# Patient Record
Sex: Male | Born: 1980 | Race: Black or African American | Hispanic: No | Marital: Single | Smoking: Current every day smoker
Health system: Southern US, Community
[De-identification: ages and names within clinical notes are randomized; demographics above are authoritative.]

## PROBLEM LIST (undated history)

## (undated) ENCOUNTER — Emergency Department (HOSPITAL_COMMUNITY): Admission: EM | Payer: BLUE CROSS/BLUE SHIELD | Source: Home / Self Care

---
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
## 2002-10-10 ENCOUNTER — Emergency Department (HOSPITAL_COMMUNITY): Admission: EM | Admit: 2002-10-10 | Discharge: 2002-10-10 | Payer: Self-pay | Admitting: Emergency Medicine

## 2002-11-04 ENCOUNTER — Emergency Department (HOSPITAL_COMMUNITY): Admission: AD | Admit: 2002-11-04 | Discharge: 2002-11-04 | Payer: Self-pay | Admitting: Emergency Medicine

## 2002-11-04 ENCOUNTER — Encounter: Payer: Self-pay | Admitting: Emergency Medicine

## 2004-01-25 ENCOUNTER — Emergency Department (HOSPITAL_COMMUNITY): Admission: EM | Admit: 2004-01-25 | Discharge: 2004-01-25 | Payer: Self-pay | Admitting: Family Medicine

## 2004-08-24 ENCOUNTER — Emergency Department (HOSPITAL_COMMUNITY): Admission: EM | Admit: 2004-08-24 | Discharge: 2004-08-24 | Payer: Self-pay | Admitting: Emergency Medicine

## 2004-09-18 ENCOUNTER — Inpatient Hospital Stay (HOSPITAL_COMMUNITY): Admission: EM | Admit: 2004-09-18 | Discharge: 2004-09-19 | Payer: Self-pay | Admitting: Emergency Medicine

## 2004-10-25 ENCOUNTER — Ambulatory Visit (HOSPITAL_COMMUNITY): Admission: RE | Admit: 2004-10-25 | Discharge: 2004-10-25 | Payer: Self-pay | Admitting: Otolaryngology

## 2005-02-20 ENCOUNTER — Emergency Department (HOSPITAL_COMMUNITY): Admission: EM | Admit: 2005-02-20 | Discharge: 2005-02-20 | Payer: Self-pay | Admitting: *Deleted

## 2005-03-12 ENCOUNTER — Emergency Department (HOSPITAL_COMMUNITY): Admission: EM | Admit: 2005-03-12 | Discharge: 2005-03-12 | Payer: Self-pay | Admitting: Emergency Medicine

## 2005-06-29 ENCOUNTER — Encounter: Payer: Self-pay | Admitting: Emergency Medicine

## 2005-11-03 ENCOUNTER — Emergency Department (HOSPITAL_COMMUNITY): Admission: EM | Admit: 2005-11-03 | Discharge: 2005-11-03 | Payer: Self-pay | Admitting: Emergency Medicine

## 2006-10-09 ENCOUNTER — Emergency Department (HOSPITAL_COMMUNITY): Admission: EM | Admit: 2006-10-09 | Discharge: 2006-10-09 | Payer: Self-pay | Admitting: Emergency Medicine

## 2007-05-06 IMAGING — CR DG KNEE COMPLETE 4+V*L*
4 series · 4 of 4 positions shown · non-contrast
Comparison: none

CLINICAL DATA: The patient fell and has medial left knee pain.  
 LEFT KNEE ? 4 VIEW:
 Negative for fracture or malalignment.  Suspicion for knee joint effusion.

[t knee ap left]
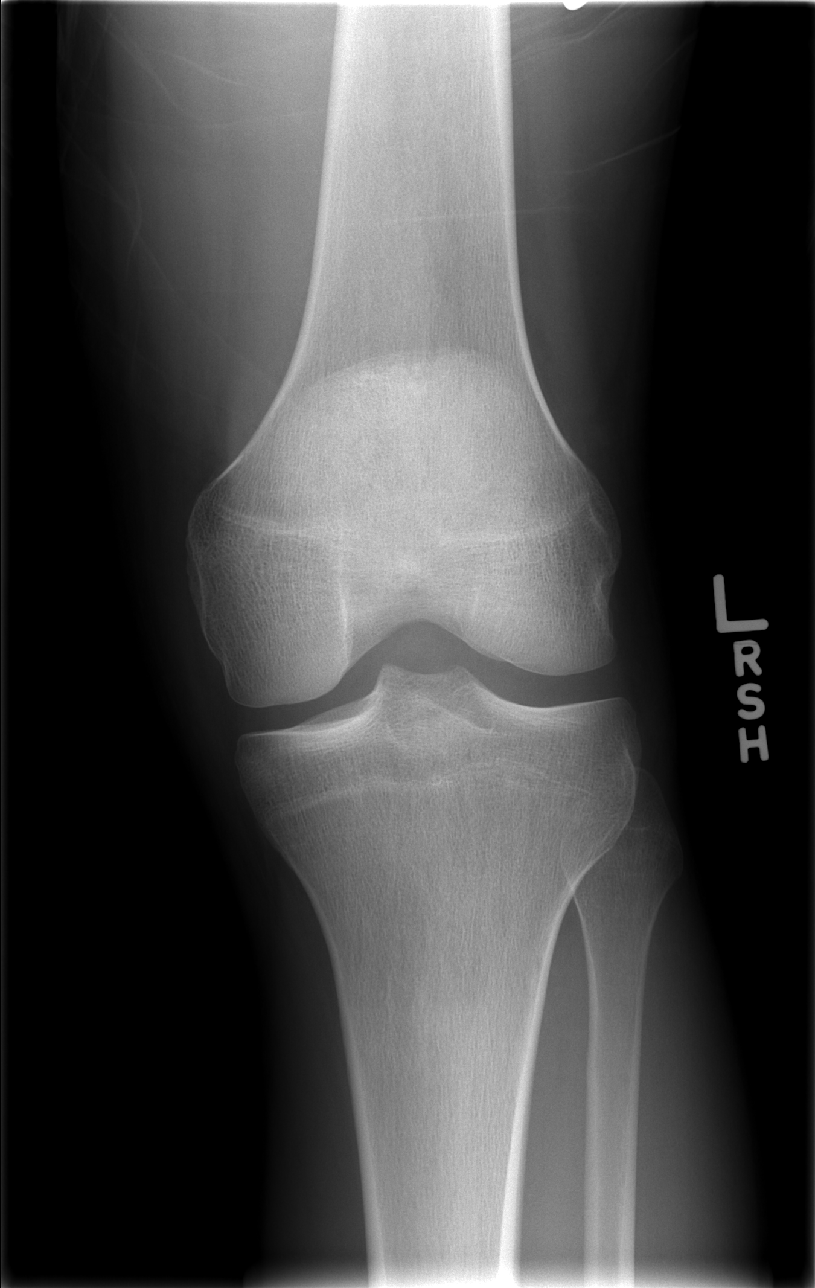

[t knee oblique left (1 of 2)]
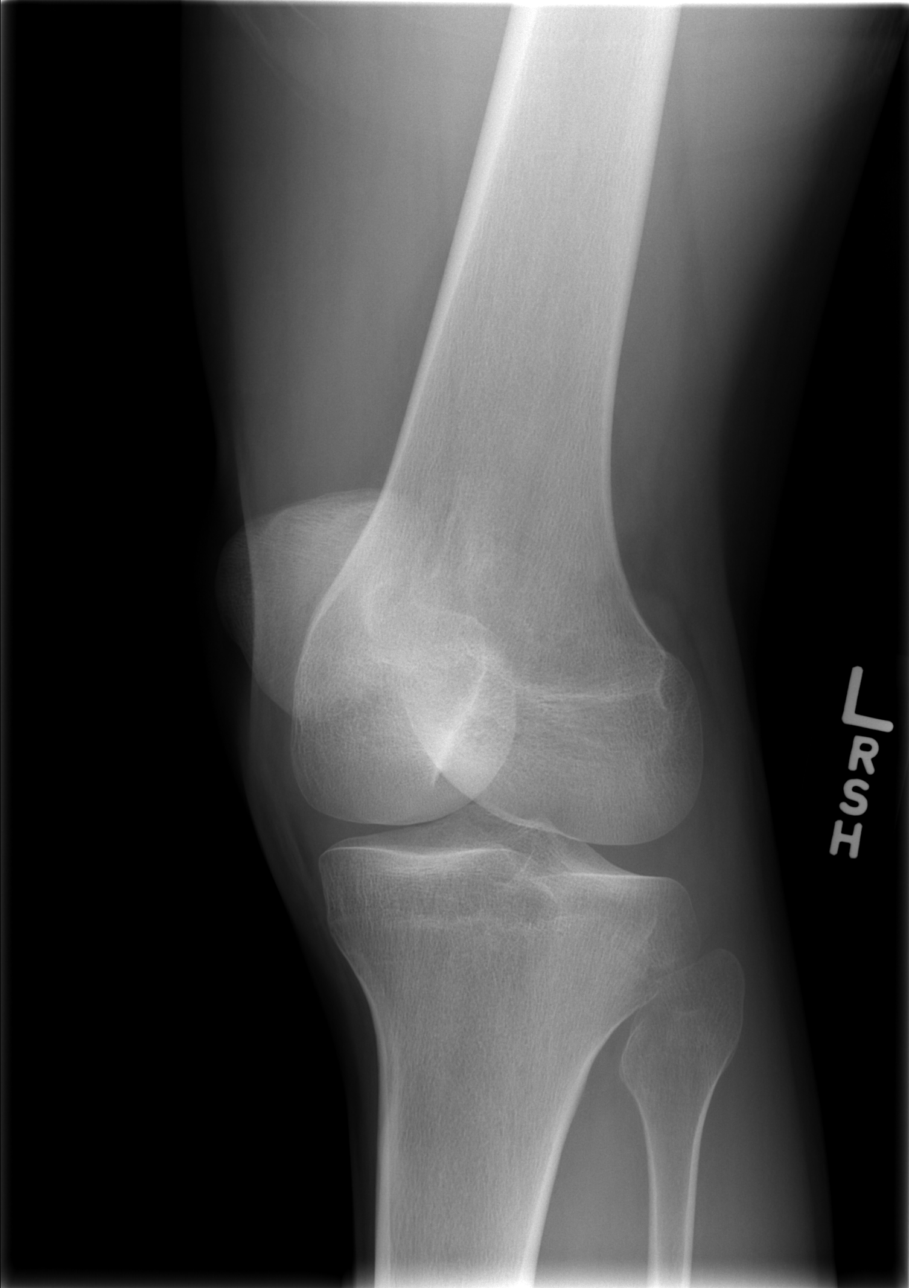

[t knee oblique left (2 of 2)]
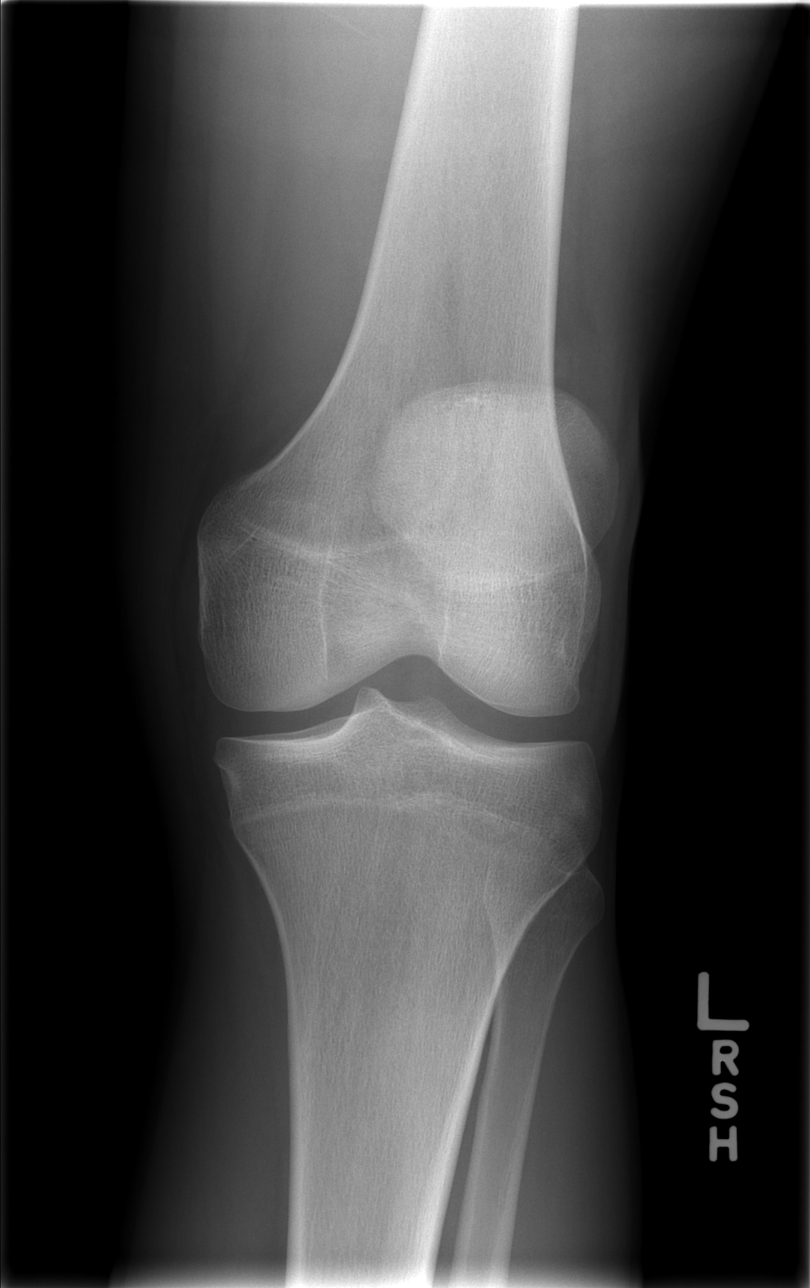

[t knee lat left]
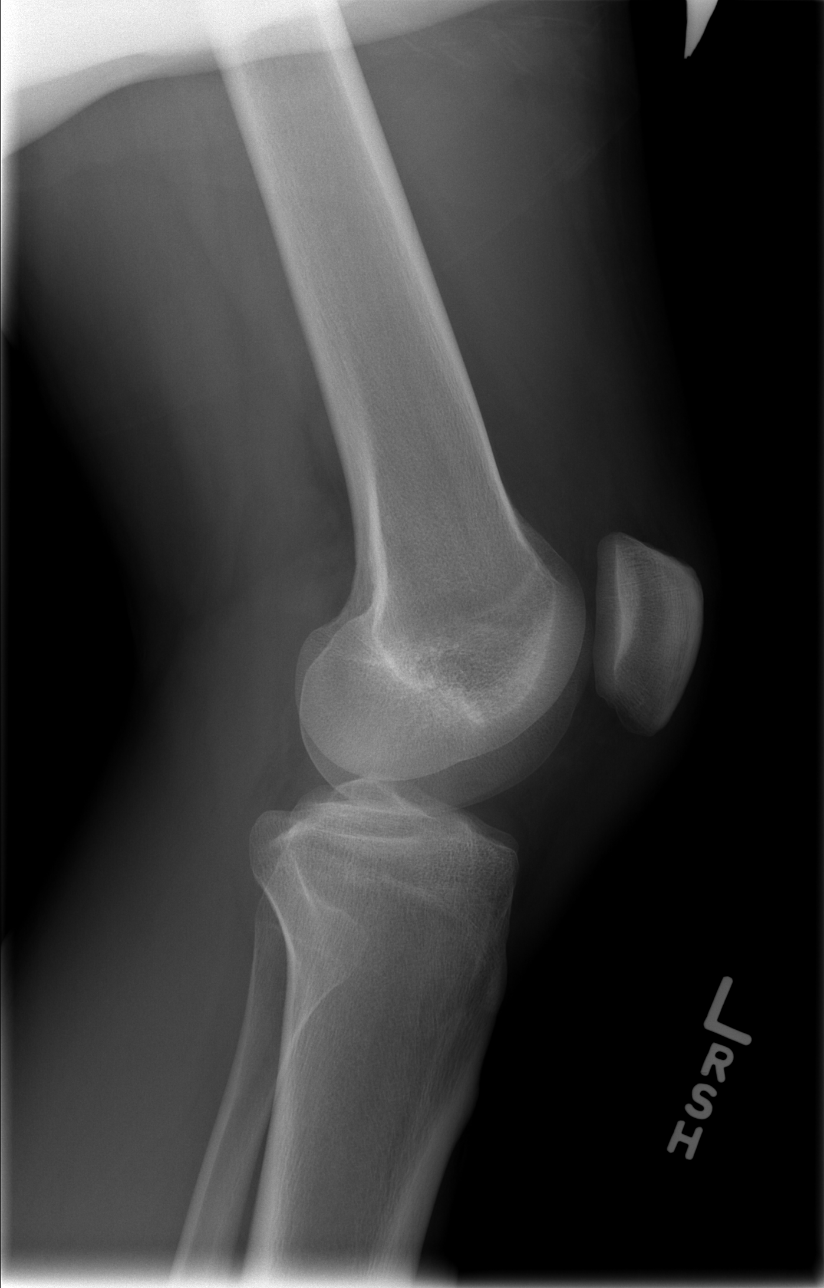

[4 of 4 positions shown; findings below may reference images not displayed]

IMPRESSION: No acute bony abnormality.  Suspicion for knee joint effusion. 
 LEFT ANKLE - 3 VIEW:
 Negative for fracture or subluxation.
IMPRESSION: No acute left ankle bony abnormality.
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
## 2010-04-27 ENCOUNTER — Inpatient Hospital Stay (INDEPENDENT_AMBULATORY_CARE_PROVIDER_SITE_OTHER)
Admission: RE | Admit: 2010-04-27 | Discharge: 2010-04-27 | Disposition: A | Discharge status: Home / Self Care | Payer: Self-pay | Source: Ambulatory Visit | Attending: Family Medicine | Admitting: Family Medicine

## 2010-04-27 ENCOUNTER — Ambulatory Visit (INDEPENDENT_AMBULATORY_CARE_PROVIDER_SITE_OTHER): Payer: Self-pay

## 2010-04-27 DIAGNOSIS — S9030XA Contusion of unspecified foot, initial encounter: Secondary | ICD-10-CM

## 2010-07-16 NOTE — Op Note (Signed)
Kevin Sims, Kevin Sims              ACCOUNT NO.:  [id number]   MEDICAL RECORD NO.:  [id number]          PATIENT TYPE:  AMB   LOCATION:  SDS                          FACILITY:  [facility]   PHYSICIAN:  Suzanna Obey, M.D.       DATE OF BIRTH:  1980/11/18   DATE OF PROCEDURE:  10/25/2004  DATE OF DISCHARGE:                                 OPERATIVE REPORT   PREOPERATIVE DIAGNOSIS:  Mandible fracture.   POSTOPERATIVE DIAGNOSIS:  Mandible fracture.   OPERATION PERFORMED:  Removal of screws.   SURGEON:  Suzanna Obey, M.D.   ANESTHESIA:  Small amount of local with sedation.   ESTIMATED BLOOD LOSS:  Less than 5 mL.   INDICATIONS FOR PROCEDURE:  This is a 30 year old who had a mandible  fracture and now has been in bicortical screws for five and a half weeks.  He now is ready to proceed.  He cut the wires over the weekend and he is  doing well.  His teeth feel like they meet well.  He is now here for screw  removal.  He was informed of the risks and benefits of the procedure  including bleeding, infection, scarring, nonunion and malunion, and numbness  of the teeth and lip, and risks of the anesthetic.  All questions were  answered and consent was obtained.   DESCRIPTION OF PROCEDURE:  The patient was taken to the operating room after  local with sedation.  The screws were removed with the extractor Leibinger  screw driver.  They were not mucosalized.  The patient tolerated this very  well.  His occlusion looks excellent and there is good opening of the  mandible.  There was minimal to no bleeding.  The injury sites looked good.  He was then already awake.  He was taken to recovery room in stable  condition.  Counts were correct.           ______________________________  Suzanna Obey, M.D.     JB/MEDQ  D:  10/25/2004  T:  10/25/2004  Job:  [id number]
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
## 2016-11-10 ENCOUNTER — Emergency Department (HOSPITAL_COMMUNITY): Payer: BLUE CROSS/BLUE SHIELD

## 2016-11-10 ENCOUNTER — Encounter (HOSPITAL_COMMUNITY): Payer: Self-pay | Admitting: Emergency Medicine

## 2016-11-10 ENCOUNTER — Emergency Department (HOSPITAL_COMMUNITY)
Admission: EM | Admit: 2016-11-10 | Discharge: 2016-11-10 | Disposition: A | Discharge status: Home / Self Care | Payer: BLUE CROSS/BLUE SHIELD | Attending: Emergency Medicine | Admitting: Emergency Medicine

## 2016-11-10 DIAGNOSIS — R1030 Lower abdominal pain, unspecified: Secondary | ICD-10-CM | POA: Insufficient documentation

## 2016-11-10 DIAGNOSIS — F17210 Nicotine dependence, cigarettes, uncomplicated: Secondary | ICD-10-CM | POA: Insufficient documentation

## 2016-11-10 DIAGNOSIS — N50811 Right testicular pain: Secondary | ICD-10-CM | POA: Diagnosis not present

## 2016-11-10 DIAGNOSIS — X500XXA Overexertion from strenuous movement or load, initial encounter: Secondary | ICD-10-CM | POA: Insufficient documentation

## 2016-11-10 LAB — URINALYSIS, ROUTINE W REFLEX MICROSCOPIC
Bilirubin Urine: NEGATIVE
GLUCOSE, UA: NEGATIVE mg/dL
HGB URINE DIPSTICK: NEGATIVE
Ketones, ur: NEGATIVE mg/dL
LEUKOCYTES UA: NEGATIVE
Nitrite: NEGATIVE
PH: 7 (ref 5.0–8.0)
PROTEIN: NEGATIVE mg/dL
SPECIFIC GRAVITY, URINE: 1.016 (ref 1.005–1.030)

## 2016-11-10 MED ORDER — IBUPROFEN 800 MG PO TABS: 800.0000 mg | ORAL_TABLET | Freq: Three times a day (TID) | ORAL | 0 refills | Status: DC

## 2016-11-10 MED ORDER — IBUPROFEN 800 MG PO TABS
800.0000 mg | ORAL_TABLET | Freq: Once | ORAL | Status: AC
  Administered 2016-11-10: 800 mg via ORAL
  Filled 2016-11-10: qty 1

## 2016-11-10 NOTE — Discharge Instructions (Signed)
1. Schedule a follow-up appointment with a urologist as listed in your discharge instructions for approximately one week.  2. Read instructions for precautionary statements regarding epididymitis and hernia.

## 2016-11-10 NOTE — ED Triage Notes (Addendum)
Patient c/o right testicle pain and swelling since Tuesday after working out/lifting weights on Monday.  Denies any urinary problems

## 2016-11-10 NOTE — ED Notes (Signed)
Bed: WA15 Expected date:  Expected time:  Means of arrival:  Comments: Triage 2  

## 2016-11-10 NOTE — ED Provider Notes (Signed)
[facility]-EMERGENCY DEPT Provider Note   CSN: [id number] Arrival date & time: 11/10/16  0854     History   Chief Complaint Chief Complaint  Patient presents with  . Testicle Pain    HPI Kevin Sims is a 36 y.o. male.  HPI Patient ports that testicular pain for about 3 days. Is been gradual in onset. Patient has been doing more lifting and working out. He denies however one particular episode where the pain started. He reports his pain in the right groin and the testicle. No associated abdominal pain or back pain. No associated pain burning or urgency of urination. No penile discharge. Patient reports is becoming more painful at work with prolonged walking and standing. No history of prior testicular disorder. No prior medical history. History reviewed. No pertinent past medical history.  There are no active problems to display for this patient.   History reviewed. No pertinent surgical history.     Home Medications    Prior to Admission medications   Not on File    Family History No family history on file.  Social History Social History  Substance Use Topics  . Smoking status: Current Every Day Smoker    Types: Cigarettes  . Smokeless tobacco: Never Used  . Alcohol use Yes     Allergies   Penicillins   Review of Systems Review of Systems 10 Systems reviewed and are negative for acute change except as noted in the HPI.   Physical Exam Updated Vital Signs BP 129/77 (BP Location: Right Arm)   Pulse 74   Resp 16   Ht 5\' 9"  (1.753 m)   Wt 87.5 kg (193 lb)   SpO2 98%   BMI 28.50 kg/m   Physical Exam  Constitutional: He is oriented to person, place, and time. He appears well-developed and well-nourished.  HENT:  Head: Normocephalic and atraumatic.  Eyes: Conjunctivae are normal.  Neck: Neck supple.  Cardiovascular: Normal rate and regular rhythm.   No murmur heard. Pulmonary/Chest: Effort normal and breath sounds normal. No respiratory distress.    Abdominal: Soft. He exhibits no distension. There is no tenderness. There is no guarding.  Genitourinary:  Genitourinary Comments: Penis normal. No scrotal swelling or edema. Right testicle has fullness or palpable nodule at the inferior pole. This appears to be about 1-2 cm. Areas tender. No mass or fullness within the inguinal canal. With Valsalva no appreciable hernia in the right inguinal canal. Left testicle is nontender.  Musculoskeletal: Normal range of motion. He exhibits no edema.  Neurological: He is alert and oriented to person, place, and time. No cranial nerve deficit. He exhibits normal muscle tone. Coordination normal.  Skin: Skin is warm and dry.  Psychiatric: He has a normal mood and affect.  Nursing note and vitals reviewed.    ED Treatments / Results  Labs (all labs ordered are listed, but only abnormal results are displayed) Labs Reviewed  URINALYSIS, ROUTINE W REFLEX MICROSCOPIC    EKG  EKG Interpretation None       Radiology No results found.  Procedures Procedures (including critical care time)  Medications Ordered in ED Medications  ibuprofen (ADVIL,MOTRIN) tablet 800 mg (not administered)     Initial Impression / Assessment and Plan / ED Course  I have reviewed the triage vital signs and the nursing notes.  Pertinent labs & imaging results that were available during my care of the patient were reviewed by me and considered in my medical decision making (see chart for details).  Final Clinical Impressions(s) / ED Diagnoses   Final diagnoses:  Right testicular pain   Ultrasound is negative. No epididymitis identified. Patient does not have pain or burning with urination. He is not having any penile discharge. Pain started with exertional type activities. At this time, I have low suspicion for STI is source of testicular pain. Higher suspicion is for mechanical, physical strain related pain. I will not opt empiric antibiotic treatment at  this time. Patient will take anti-inflammatories elevation and icing. Follow-up plan recommended. New Prescriptions New Prescriptions   No medications on file     Arby Barrette, MD 11/10/16 1156

## 2016-11-11 ENCOUNTER — Telehealth: Payer: Self-pay | Admitting: Student

## 2016-11-11 DIAGNOSIS — A749 Chlamydial infection, unspecified: Secondary | ICD-10-CM

## 2016-11-11 LAB — GC/CHLAMYDIA PROBE AMP (~~LOC~~) NOT AT ARMC
CHLAMYDIA, DNA PROBE: POSITIVE — AB
Neisseria Gonorrhea: NEGATIVE

## 2016-11-11 MED ORDER — AZITHROMYCIN 500 MG PO TABS: 1000.0000 mg | ORAL_TABLET | Freq: Once | ORAL | 0 refills | Status: AC

## 2016-11-11 NOTE — Telephone Encounter (Addendum)
Kevin Sims tested positive for  Chlamydia. Patient was called by RN and allergies and pharmacy confirmed. Rx sent to pharmacy of choice.   Judeth Horn, NP 11/11/2016 5:15 PM       ----- Message from Kathe Becton, RN sent at 11/11/2016  4:00 PM EDT ----- This patient tested positive for:"Chlamydia"," He::"is allergic to  Penicillin"  I have informed the patient of his results and confirmed her pharmacy is correct in his chart. Please send Rx.   Thank you,   Kathe Becton, RN  Pt. Called back and asked to have the medication called in.  Results faxed to [facility].

## 2022-08-27 ENCOUNTER — Emergency Department (HOSPITAL_COMMUNITY)
Admission: EM | Admit: 2022-08-27 | Discharge: 2022-08-27 | Discharge status: Against Medical Advice | Payer: Medicaid Other | Attending: Emergency Medicine | Admitting: Emergency Medicine

## 2022-08-27 DIAGNOSIS — Z5321 Procedure and treatment not carried out due to patient leaving prior to being seen by health care provider: Secondary | ICD-10-CM | POA: Insufficient documentation

## 2022-08-27 DIAGNOSIS — R4182 Altered mental status, unspecified: Secondary | ICD-10-CM | POA: Insufficient documentation

## 2022-08-27 NOTE — ED Notes (Signed)
Pt was seen by staff leaving triage area.  Male visitor followed Pt out.

## 2022-08-28 ENCOUNTER — Other Ambulatory Visit: Payer: Self-pay

## 2022-08-28 ENCOUNTER — Emergency Department (HOSPITAL_COMMUNITY)
Admission: EM | Admit: 2022-08-28 | Discharge: 2022-08-30 | Disposition: A | Discharge status: Other Acute Inpatient Hospital | Payer: Medicaid Other | Attending: Emergency Medicine | Admitting: Emergency Medicine

## 2022-08-28 ENCOUNTER — Encounter (HOSPITAL_COMMUNITY): Payer: Self-pay | Admitting: *Deleted

## 2022-08-28 DIAGNOSIS — Z23 Encounter for immunization: Secondary | ICD-10-CM | POA: Insufficient documentation

## 2022-08-28 DIAGNOSIS — F19959 Other psychoactive substance use, unspecified with psychoactive substance-induced psychotic disorder, unspecified: Secondary | ICD-10-CM

## 2022-08-28 DIAGNOSIS — R456 Violent behavior: Secondary | ICD-10-CM | POA: Insufficient documentation

## 2022-08-28 DIAGNOSIS — Z20822 Contact with and (suspected) exposure to covid-19: Secondary | ICD-10-CM | POA: Diagnosis not present

## 2022-08-28 DIAGNOSIS — F1090 Alcohol use, unspecified, uncomplicated: Secondary | ICD-10-CM | POA: Diagnosis not present

## 2022-08-28 DIAGNOSIS — F19951 Other psychoactive substance use, unspecified with psychoactive substance-induced psychotic disorder with hallucinations: Secondary | ICD-10-CM | POA: Diagnosis not present

## 2022-08-28 DIAGNOSIS — F1990 Other psychoactive substance use, unspecified, uncomplicated: Secondary | ICD-10-CM

## 2022-08-28 DIAGNOSIS — Z79899 Other long term (current) drug therapy: Secondary | ICD-10-CM | POA: Insufficient documentation

## 2022-08-28 LAB — CBC WITH DIFFERENTIAL/PLATELET
Abs Immature Granulocytes: 0.05 10*3/uL (ref 0.00–0.07)
Basophils Absolute: 0 10*3/uL (ref 0.0–0.1)
Basophils Relative: 0 %
Eosinophils Absolute: 0.1 10*3/uL (ref 0.0–0.5)
Eosinophils Relative: 1 %
HCT: 41.7 % (ref 39.0–52.0)
Hemoglobin: 14.2 g/dL (ref 13.0–17.0)
Immature Granulocytes: 0 %
Lymphocytes Relative: 8 %
Lymphs Abs: 0.9 10*3/uL (ref 0.7–4.0)
MCH: 30.5 pg (ref 26.0–34.0)
MCHC: 34.1 g/dL (ref 30.0–36.0)
MCV: 89.5 fL (ref 80.0–100.0)
Monocytes Absolute: 0.9 10*3/uL (ref 0.1–1.0)
Monocytes Relative: 8 %
Neutro Abs: 9.6 10*3/uL — ABNORMAL HIGH (ref 1.7–7.7)
Neutrophils Relative %: 83 %
Platelets: 278 10*3/uL (ref 150–400)
RBC: 4.66 MIL/uL (ref 4.22–5.81)
RDW: 14.6 % (ref 11.5–15.5)
WBC: 11.5 10*3/uL — ABNORMAL HIGH (ref 4.0–10.5)
nRBC: 0 % (ref 0.0–0.2)

## 2022-08-28 LAB — COMPREHENSIVE METABOLIC PANEL
ALT: 45 U/L — ABNORMAL HIGH (ref 0–44)
AST: 120 U/L — ABNORMAL HIGH (ref 15–41)
Albumin: 4.4 g/dL (ref 3.5–5.0)
Alkaline Phosphatase: 37 U/L — ABNORMAL LOW (ref 38–126)
Anion gap: 12 (ref 5–15)
BUN: 28 mg/dL — ABNORMAL HIGH (ref 6–20)
CO2: 22 mmol/L (ref 22–32)
Calcium: 9.3 mg/dL (ref 8.9–10.3)
Chloride: 101 mmol/L (ref 98–111)
Creatinine, Ser: 1.35 mg/dL — ABNORMAL HIGH (ref 0.61–1.24)
GFR, Estimated: >60 mL/min (ref >60)
Glucose, Bld: 122 mg/dL — ABNORMAL HIGH (ref 70–99)
Potassium: 4.2 mmol/L (ref 3.5–5.1)
Sodium: 135 mmol/L (ref 135–145)
Total Bilirubin: 1.4 mg/dL — ABNORMAL HIGH (ref 0.3–1.2)
Total Protein: 7.7 g/dL (ref 6.5–8.1)

## 2022-08-28 LAB — URINALYSIS, ROUTINE W REFLEX MICROSCOPIC
Bacteria, UA: NONE SEEN
Bilirubin Urine: NEGATIVE
Glucose, UA: NEGATIVE mg/dL
Hgb urine dipstick: NEGATIVE
Ketones, ur: NEGATIVE mg/dL
Leukocytes,Ua: NEGATIVE
Nitrite: NEGATIVE
Protein, ur: 30 mg/dL — AB
Specific Gravity, Urine: 1.031 — ABNORMAL HIGH (ref 1.005–1.030)
pH: 5 (ref 5.0–8.0)

## 2022-08-28 LAB — RAPID URINE DRUG SCREEN, HOSP PERFORMED
Amphetamines: NOT DETECTED
Barbiturates: NOT DETECTED
Benzodiazepines: NOT DETECTED
Cocaine: NOT DETECTED
Opiates: NOT DETECTED
Tetrahydrocannabinol: POSITIVE — AB

## 2022-08-28 LAB — ETHANOL: Alcohol, Ethyl (B): <10 mg/dL (ref <10)

## 2022-08-28 LAB — CK
Total CK: 3490 U/L — ABNORMAL HIGH (ref 49–397)
Total CK: 2684 U/L — ABNORMAL HIGH (ref 49–397)

## 2022-08-28 LAB — SARS CORONAVIRUS 2 BY RT PCR: SARS Coronavirus 2 by RT PCR: NEGATIVE

## 2022-08-28 LAB — SALICYLATE LEVEL: Salicylate Lvl: <7 mg/dL — ABNORMAL LOW (ref 7.0–30.0)

## 2022-08-28 LAB — ACETAMINOPHEN LEVEL: Acetaminophen (Tylenol), Serum: <10 ug/mL — ABNORMAL LOW (ref 10–30)

## 2022-08-28 MED ORDER — HALOPERIDOL 5 MG PO TABS: 5.0000 mg | ORAL_TABLET | Freq: Four times a day (QID) | ORAL | Status: DC | PRN

## 2022-08-28 MED ORDER — LORAZEPAM 1 MG PO TABS: 2.0000 mg | ORAL_TABLET | Freq: Four times a day (QID) | ORAL | Status: DC | PRN

## 2022-08-28 MED ORDER — LORAZEPAM 2 MG/ML IJ SOLN
2.0000 mg | Freq: Four times a day (QID) | INTRAMUSCULAR | Status: DC | PRN
  Administered 2022-08-28 – 2022-08-29 (×2): 2 mg via INTRAMUSCULAR
  Filled 2022-08-28 (×2): qty 1

## 2022-08-28 MED ORDER — DIPHENHYDRAMINE HCL 50 MG/ML IJ SOLN
50.0000 mg | Freq: Four times a day (QID) | INTRAMUSCULAR | Status: DC | PRN
  Administered 2022-08-28: 50 mg via INTRAMUSCULAR
  Filled 2022-08-28: qty 1

## 2022-08-28 MED ORDER — DIPHENHYDRAMINE HCL 25 MG PO CAPS: 50.0000 mg | ORAL_CAPSULE | Freq: Four times a day (QID) | ORAL | Status: DC | PRN

## 2022-08-28 MED ORDER — LORAZEPAM 1 MG PO TABS
1.0000 mg | ORAL_TABLET | Freq: Once | ORAL | Status: AC
  Administered 2022-08-28: 1 mg via ORAL
  Filled 2022-08-28: qty 1

## 2022-08-28 MED ORDER — HALOPERIDOL LACTATE 5 MG/ML IJ SOLN
5.0000 mg | Freq: Once | INTRAMUSCULAR | Status: AC
  Administered 2022-08-28: 5 mg via INTRAVENOUS
  Filled 2022-08-28: qty 1

## 2022-08-28 MED ORDER — LIDOCAINE HCL URETHRAL/MUCOSAL 2 % EX GEL
1.0000 | Freq: Once | CUTANEOUS | Status: AC
  Administered 2022-08-28: 1 via TOPICAL
  Filled 2022-08-28: qty 11

## 2022-08-28 MED ORDER — HALOPERIDOL LACTATE 5 MG/ML IJ SOLN
5.0000 mg | Freq: Four times a day (QID) | INTRAMUSCULAR | Status: DC | PRN
  Administered 2022-08-28: 5 mg via INTRAMUSCULAR
  Filled 2022-08-28: qty 1

## 2022-08-28 MED ORDER — LACTATED RINGERS IV BOLUS
1000.0000 mL | Freq: Once | INTRAVENOUS | Status: AC
  Administered 2022-08-28: 1000 mL via INTRAVENOUS

## 2022-08-28 MED ORDER — SODIUM CHLORIDE 0.9 % IV BOLUS
1000.0000 mL | Freq: Once | INTRAVENOUS | Status: AC
  Administered 2022-08-28: 1000 mL via INTRAVENOUS

## 2022-08-28 MED ORDER — TETANUS-DIPHTH-ACELL PERTUSSIS 5-2.5-18.5 LF-MCG/0.5 IM SUSY
0.5000 mL | PREFILLED_SYRINGE | Freq: Once | INTRAMUSCULAR | Status: AC
  Administered 2022-08-28: 0.5 mL via INTRAMUSCULAR
  Filled 2022-08-28: qty 0.5

## 2022-08-28 MED ORDER — ZIPRASIDONE MESYLATE 20 MG IM SOLR
20.0000 mg | Freq: Once | INTRAMUSCULAR | Status: AC
  Administered 2022-08-28: 20 mg via INTRAMUSCULAR
  Filled 2022-08-28: qty 20

## 2022-08-28 MED ORDER — LACTATED RINGERS IV SOLN: INTRAVENOUS | Status: DC

## 2022-08-28 NOTE — ED Provider Notes (Signed)
[place] EMERGENCY DEPARTMENT AT [facility] Provider Note   CSN: [id number] Arrival date & time: 08/28/22  0133     History  Chief Complaint  Patient presents with   Medical Clearance    Kevin Sims is a 42 y.o. male.  The history is provided by the patient.  Kevin Sims is a 42 y.o. male who presents to the Emergency Department complaining of IVC.  He presents to the emergency department under IVC.  Patient is unable to provide meaningful history.  He denies SI, HI.  When questioned regarding hallucinations he states that if he was having hallucinations he would not be telling me.  He does report occasional alcohol use, none today.  Denies any drug use.  He denies any medical problems or recent illnesses.  Per IVC patient started acting strangely 5 days ago.  He began talking to objects that were not visible to other people and became aggressive and hostile.  He stopped eating and sleeping.  He started taking meth and using weed.  He has been walking long distances all around town and sometimes walks in the road not looking up and PEG attention to safety.  Tonight he started standing in peoples yards and knocking on doors.     Home Medications Prior to Admission medications   Medication Sig Start Date End Date Taking? Authorizing Provider  ibuprofen (ADVIL,MOTRIN) 800 MG tablet Take 1 tablet (800 mg total) by mouth 3 (three) times daily. 11/10/16   Arby Barrette, MD      Allergies    Penicillins    Review of Systems   Review of Systems  All other systems reviewed and are negative.   Physical Exam Updated Vital Signs BP 117/76 (BP Location: Right Arm)   Pulse 97   Temp 97.9 F (36.6 C) (Oral)   Resp 17   Ht 5\' 9"  (1.753 m)   Wt 83.9 kg   SpO2 100%   BMI 27.32 kg/m  Physical Exam Vitals and nursing note reviewed.  Constitutional:      Appearance: He is well-developed.  HENT:     Head: Normocephalic and atraumatic.  Cardiovascular:     Rate  and Rhythm: Normal rate and regular rhythm.  Pulmonary:     Effort: Pulmonary effort is normal. No respiratory distress.  Musculoskeletal:        General: No tenderness.     Comments: There is an abrasion to the posterior left Achilles.  There is a spontaneously ruptured blister to the plantar surface of the right foot.  Skin:    General: Skin is warm and dry.  Neurological:     Mental Status: He is alert and oriented to person, place, and time.     Comments: 5/5 strength in all four extremities.   Psychiatric:     Comments: Flat affect, does not make eye contact.      ED Results / Procedures / Treatments   Labs (all labs ordered are listed, but only abnormal results are displayed) Labs Reviewed  COMPREHENSIVE METABOLIC PANEL - Abnormal; Notable for the following components:      Result Value   Glucose, Bld 122 (*)    BUN 28 (*)    Creatinine, Ser 1.35 (*)    AST 120 (*)    ALT 45 (*)    Alkaline Phosphatase 37 (*)    Total Bilirubin 1.4 (*)    All other components within normal limits  CBC WITH DIFFERENTIAL/PLATELET - Abnormal; Notable for the  following components:   WBC 11.5 (*)    Neutro Abs 9.6 (*)    All other components within normal limits  ACETAMINOPHEN LEVEL - Abnormal; Notable for the following components:   Acetaminophen (Tylenol), Serum <10 (*)    All other components within normal limits  SALICYLATE LEVEL - Abnormal; Notable for the following components:   Salicylate Lvl <7.0 (*)    All other components within normal limits  CK - Abnormal; Notable for the following components:   Total CK 3,490 (*)    All other components within normal limits  ETHANOL  RAPID URINE DRUG SCREEN, HOSP PERFORMED  URINALYSIS, ROUTINE W REFLEX MICROSCOPIC    EKG None  Radiology No results found.  Procedures Procedures    Medications Ordered in ED Medications  Tdap (BOOSTRIX) injection 0.5 mL (0.5 mLs Intramuscular Given 08/28/22 0427)  lidocaine (XYLOCAINE) 2 % jelly  1 Application (1 Application Topical Given 08/28/22 0426)  sodium chloride 0.9 % bolus 1,000 mL (1,000 mLs Intravenous New Bag/Given 08/28/22 0608)  ziprasidone (GEODON) injection 20 mg (20 mg Intramuscular Given 08/28/22 0542)    ED Course/ Medical Decision Making/ A&P                             Medical Decision Making Amount and/or Complexity of Data Reviewed Labs: ordered.  Risk Prescription drug management.   Patient without significant medical history here for evaluation under IVC.  Per family report patient has been using meth, hallucinating and agitated and hostile and has been walking barefoot on the pavement.  Patient is calm in the emergency department but guarded, denies any SI, HI.  He does have spontaneously ruptured blisters to his feet on examination, no evidence of foreign body or infection.  Tetanus updated.  Local wound care provided.  First exam completed for patient's safety.  During patient's ED weight he became abruptly agitated, difficult to redirect and required Geodon for patient and staff safety.  Labs with mild elevation in AST's, creatinine and BUN-concern for possible rhabdomyolysis given reported extensive walking.  Plan to obtain CPK.  Will provide IV fluid hydration.  Patient care transferred pending CPK.        Final Clinical Impression(s) / ED Diagnoses Final diagnoses:  None    Rx / DC Orders ED Discharge Orders     None         Tilden Fossa, MD 08/28/22 [phone]

## 2022-08-28 NOTE — ED Notes (Signed)
Pt is becoming more restless, pacing around stretcher.  When I asked if he needed more water or what I could do to make him comfortable he would not acknowledge that I was speaking to him. He is mumbling to himself or actively hallucinating. EDP made aware.

## 2022-08-28 NOTE — ED Provider Notes (Addendum)
  Physical Exam  BP 117/76 (BP Location: Right Arm)   Pulse 97   Temp 97.9 F (36.6 C) (Oral)   Resp 17   Ht 5\' 9"  (1.753 m)   Wt 83.9 kg   SpO2 100%   BMI 27.32 kg/m   Physical Exam  Procedures  Procedures  ED Course / MDM    Medical Decision Making Amount and/or Complexity of Data Reviewed Labs: ordered.  Risk Prescription drug management.     Received in signout.  Elevation of LFTs and creatinine mildly elevated.  Pending CK.  However CK has come back somewhat elevated.  Will give fluid bolus and recheck.    Benjiman Core, MD 08/28/22 0725  Patient has become more energetic and wandering.  Will give oral Ativan.  Has been seen by psychiatry.   Benjiman Core, MD 08/28/22 1428  Continue to wandering agitation.  Will now give some Haldol IV.  Potentially will require restraints pending medication effect.    Benjiman Core, MD 08/28/22 1438

## 2022-08-28 NOTE — ED Notes (Signed)
Pt up pacing the hallways with safety sitter. Pt attempted to leave the ed at this time. Securtiy with pt back to pts stretcher where pt refuses to sit. Provider made aware.

## 2022-08-28 NOTE — Progress Notes (Signed)
LCSW Progress Note  [id number]   Kevin Sims  08/28/2022  4:10 PM    Inpatient Behavioral Health Placement  Pt meets inpatient criteria per Aurther Loft NP. There are no available beds within [facility] per [facility] Erica Wright,RN. Referral was sent to the following facilities;   Destination  Service Provider Address Phone [facility]  [address] [place], [place] [address] [phone]  [facility]  [address] [place] [address] [phone]  [facility]  [address] [place] [address] [phone]  [facility] [address]  [address] [place] [address] [phone]  [facility]  [address] [place] [address] [phone]  [facility]  [phone] [place] [address] [phone]  [facility]  [address] [place] [address] [phone]  [facility]  [address] [place] [address] [phone]  [facility]  [address], [place] [address] [phone]  [facility]  [address] [place] [address] [phone]  [facility]  [address], [place] [address] [phone]  [facility]  [address], [place] [address] [phone]  [facility]  [address] [place] [address] [phone]  [facility]  [address] [place] [address] [phone]  [facility]  [address], [place] [address] [phone]  [facility]  [address] [place] [address] [phone]  [facility]  [address], [place] [address] [phone]  [facility]  [address], [place] [address] [phone]  [facility]  [address], [place] [address] [phone]  [facility]  [address] [place], [place] [address] [phone]  [facility]  [address], [place] [address] [phone]  [facility]  [address], [place] [address] [phone]  [facility]  [address] [place] [address] [phone]  [facility]  [address] [place] [address] [phone]  [facility]  [address] [place], [place] [address] [phone]  [facility] [place] [address] [phone]  [facility]  [address] [place] [address] [phone]  [facility]  [address] [place] [address] [phone]  [facility]  [address], [place] [address] [phone]  [facility]  [address] [place] [address] [phone]  [facility]  [address] [place] [address] [phone]    Situation ongoing,  CSW will follow up.    Maryjean Ka, MSW, LCSWA 08/28/2022 4:10 PM

## 2022-08-28 NOTE — Consult Note (Cosign Needed Addendum)
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 caucasian male with a past psychiatric history that includes none, who presented this encounter by way of [employer] under involuntary commitment due to strange behaviors in the community and safety concerns for the patient.  Patient currently remains under involuntary commitment.  HPI:    Patient seen today for face-to-face evaluation at [facility] emergency department.  Upon evaluation, patient presents with atypical and irritable interpersonal style, he is a short, vague, and evasive in answering this writer's questions. Patient denies any current or past history of drug use, EtOH use, and/or tobacco use. Patient orientation is grossly intact, patient endorses that he won't answer orientation questions. Patient endorses no auditory visual hallucinations, states " wouldn't tell you if I was".  Patient endorses no history of suicidal or homicidal ideations, suicide attempts, and/or self injures behavior.  Patient endorses no formal mental health history, denies mental health hospitalizations, denies outpatient care, denies counseling or pharmacological intervention for mental health. Patient denies paranoia and does not present with any delusional themes overtly. Patient questioned about statements given from his Sister Nicki [place] and [place], both of whom state that the patient has been using stimulants that they suspect is meth or cocaine, but he simply states "they don't know what they're talking about".  Patient endorses he does not know why  he is at the hospital.  Patient endorses his mood as "does it matter".  Objectively, the patient does not appear to be presenting with responding to internal stimuli.  Collateral (girlfriend Chquita) [phone]  Call placed for collateral to patient's girlfriend.  Patient's girlfriend endorses that the patient has been acting severely not himself since last Sunday after they went to a family get together over at the patient's sister's house.  Apparently during the get together that they had on Sunday, the patient's sister endorsed that she was going to be put out on the streets due to severe financial lack of resources, that the patient apparently did not handle well at all.  Patient's girlfriend states that after this get together, immediately over the next several days the patient began drinking excessively, leaving the house very often coming and going without saying anything to her, increasing his cigarette use from 5 to 10 cigarettes/day to several packs of cigarettes per day, presenting very aggressive , bizarre, and hostile during any encounters they had together, not sleeping or eating, and walking long distances all around town and up roadways. Patient's girlfriend reports that they have been together for some time now but does not elaborate on an official length of time, but does endorse that the patient got out of prison October of last year, does not elaborate on what exactly he was in prison for.  Patient's girlfriend reports that she knows that he has a severe substance abuse history that she was told at length about from the patient's "baby mama"; tells this Clinical research associate that "baby mama" has told her all about his severe amphetamine use and that he has presented like this similarly in the past, though she has not notably ever seen this type of behavior before.  Patient girlfriend reports that she was present when patient was picked up and ultimately placed under involuntary care by [employer], reports that  he had a spoon and a plastic baggy on him that was empty, though she is unsure of what exactly the patient had been using in regards to a substance.  She does report additionally that when this all started and the patient began doing odd behaviors around the house, that she did catch him outside sniffing a substance, though she is unclear of what exactly it was.  She endorses that the patient does use marijuana, tobacco, and alcohol semiregularly, but no history of EtOH disorder.   Past Psychiatric History: None  Risk to Self or Others: Is the patient at risk to self? Yes Has the patient been a risk to self in the past 6 months? No Has the patient been a risk to self within the distant past? No Is the patient a risk to others? No Has the patient been a risk to others in the past 6 months? No Has the patient been a risk to others within the distant past? No  [place] Scale:  Flowsheet Row ED from 08/28/2022 in [facility] Emergency Department at [facility]  C-SSRS RISK CATEGORY No Risk      Substance Abuse: Reports of amphetamine use, THC use, tobacco use, and EtOH use  Past Medical History: History reviewed. No pertinent past medical history. History reviewed. No pertinent surgical history. Family History: History reviewed. No pertinent family history. Family Psychiatric  History: None endorsed Social History:  Social History   Substance and Sexual Activity  Alcohol Use Yes     Social History   Substance and Sexual Activity  Drug Use Yes   Types: Marijuana    Social History   Socioeconomic History   Marital status: Single    Spouse name: Not on file   Number of children: Not on file   Years of education: Not on file   Highest education level: Not on file  Occupational History   Not on file  Tobacco Use   Smoking status: Every Day    Types: Cigarettes   Smokeless tobacco: Never  Vaping Use   Vaping Use: Every day  Substance and Sexual Activity   Alcohol  use: Yes   Drug use: Yes    Types: Marijuana   Sexual activity: Not on file  Other Topics Concern   Not on file  Social History Narrative   Not on file   Social Determinants of Health   Financial Resource Strain: Not on file  Food Insecurity: Not on file  Transportation Needs: Not on file  Physical Activity: Not on file  Stress: Not on file  Social Connections: Not on file   Additional Social History:    Allergies:   Allergies  Allergen Reactions   Penicillins     Labs:  Results for orders placed or performed during the hospital encounter of 08/28/22 (from the past 48 hour(s))  Comprehensive metabolic panel     Status: Abnormal   Collection Time: 08/28/22  3:51 AM  Result Value Ref Range   Sodium 135 135 - 145 mmol/L   Potassium 4.2 3.5 - 5.1 mmol/L   Chloride 101 98 - 111 mmol/L   CO2 22 22 - 32 mmol/L   Glucose, Bld 122 (H) 70 - 99 mg/dL    Comment: Glucose reference range applies only to samples taken after fasting for at least 8 hours.   BUN 28 (  H) 6 - 20 mg/dL   Creatinine, Ser 8.29 (H) 0.61 - 1.24 mg/dL   Calcium 9.3 8.9 - 56.2 mg/dL   Total Protein 7.7 6.5 - 8.1 g/dL   Albumin 4.4 3.5 - 5.0 g/dL   AST 130 (H) 15 - 41 U/L   ALT 45 (H) 0 - 44 U/L   Alkaline Phosphatase 37 (L) 38 - 126 U/L   Total Bilirubin 1.4 (H) 0.3 - 1.2 mg/dL   GFR, Estimated >86 >57 mL/min    Comment: (NOTE) Calculated using the CKD-EPI Creatinine Equation (2021)    Anion gap 12 5 - 15    Comment: Performed at [facility], [address] [place], [place] [address]  CBC with Differential     Status: Abnormal   Collection Time: 08/28/22  3:51 AM  Result Value Ref Range   WBC 11.5 (H) 4.0 - 10.5 K/uL   RBC 4.66 4.22 - 5.81 MIL/uL   Hemoglobin 14.2 13.0 - 17.0 g/dL   HCT 29.5 28.4 - 13.2 %   MCV 89.5 80.0 - 100.0 fL   MCH 30.5 26.0 - 34.0 pg   MCHC 34.1 30.0 - 36.0 g/dL   RDW 44.0 10.2 - 72.5 %   Platelets 278 150 - 400 K/uL   nRBC 0.0 0.0 - 0.2 %   Neutrophils Relative  % 83 %   Neutro Abs 9.6 (H) 1.7 - 7.7 K/uL   Lymphocytes Relative 8 %   Lymphs Abs 0.9 0.7 - 4.0 K/uL   Monocytes Relative 8 %   Monocytes Absolute 0.9 0.1 - 1.0 K/uL   Eosinophils Relative 1 %   Eosinophils Absolute 0.1 0.0 - 0.5 K/uL   Basophils Relative 0 %   Basophils Absolute 0.0 0.0 - 0.1 K/uL   Immature Granulocytes 0 %   Abs Immature Granulocytes 0.05 0.00 - 0.07 K/uL    Comment: Performed at [facility] Lab, [address], [place], [place] [address]  Acetaminophen level     Status: Abnormal   Collection Time: 08/28/22  3:51 AM  Result Value Ref Range   Acetaminophen (Tylenol), Serum <10 (L) 10 - 30 ug/mL    Comment: (NOTE) Therapeutic concentrations vary significantly. A range of 10-30 ug/mL  may be an effective concentration for many patients. However, some  are best treated at concentrations outside of this range. Acetaminophen concentrations >150 ug/mL at 4 hours after ingestion  and >50 ug/mL at 12 hours after ingestion are often associated with  toxic reactions.  Performed at [facility], [address] [place], [place] [address]   Salicylate level     Status: Abnormal   Collection Time: 08/28/22  3:51 AM  Result Value Ref Range   Salicylate Lvl <7.0 (L) 7.0 - 30.0 mg/dL    Comment: Performed at [facility], [address] [place], [place] [address]  Ethanol     Status: None   Collection Time: 08/28/22  3:51 AM  Result Value Ref Range   Alcohol, Ethyl (B) <10 <10 mg/dL    Comment: (NOTE) Lowest detectable limit for serum alcohol is 10 mg/dL.  For medical purposes only. Performed at [facility], [address] [place], [place] [address]   CK     Status: Abnormal   Collection Time: 08/28/22  3:51 AM  Result Value Ref Range   Total CK 3,490 (H) 49 - 397 U/L    Comment: Performed at [facility], [address] [place], [place] [address]  CK     Status: Abnormal   Collection Time: 08/28/22 10:01 AM  Result Value Ref Range    Total CK 2,684 (H) 49 - 397 U/L    Comment: Performed at [facility], [address], [place], [place] [address]    Current Facility-Administered Medications  Medication Dose Route Frequency Provider Last Rate Last Admin   lactated ringers infusion   Intravenous Continuous Benjiman Core, MD 200 mL/hr at 08/28/22 1137 New Bag at 08/28/22 1137   No current outpatient medications on file.    Musculoskeletal: Strength & Muscle Tone: within normal limits Gait & Station: normal Patient leans: N/A   Psychiatric Specialty Exam: Presentation  General Appearance:  Disheveled (Irritable and atypical interpersonal style)  Eye Contact: Absent  Speech: Clear and Coherent; Normal Rate  Speech Volume: Normal  Handedness: Right   Mood and Affect  Mood: Irritable  Affect: Constricted; Congruent   Thought Process  Thought Processes: Other (comment) (Vague, short, minimal, evasive)  Descriptions of Associations:Intact  Orientation:Other (comment) (Grossly intact)  Thought Content:Other (comment) (Vague, evasive, reserved)  History of Schizophrenia/Schizoaffective disorder:No data recorded Duration of Psychotic Symptoms:No data recorded Hallucinations:Hallucinations: None  Ideas of Reference:None  Suicidal Thoughts:Suicidal Thoughts: No  Homicidal Thoughts:Homicidal Thoughts: No   Sensorium  Memory: Immediate Poor; Recent Poor; Remote Poor  Judgment: Impaired  Insight: Lacking   Executive Functions  Concentration: Poor  Attention Span: Poor  Recall: Poor  Fund of Knowledge: Poor  Language: Poor   Psychomotor Activity  Psychomotor Activity: Psychomotor Activity: Normal   Assets  Assets: Social Support; Housing; Intimacy; Physical Health; Vocational/Educational    Sleep  Sleep: Sleep: Poor   Physical Exam: Physical Exam Vitals and nursing note reviewed.  Constitutional:      General: He is not in acute distress.     Appearance: He is normal weight. He is not ill-appearing, toxic-appearing or diaphoretic.  Pulmonary:     Effort: Pulmonary effort is normal.  Feet:     Right foot:     Skin integrity: Blister present.     Left foot:     Skin integrity: Blister present.  Neurological:     Mental Status: He is alert.     Comments: Grossly intact orientation   Psychiatric:        Attention and Perception: Attention normal. He does not perceive auditory or visual hallucinations.        Behavior: Behavior is uncooperative and agitated.        Thought Content: Thought content is not paranoid or delusional. Thought content does not include homicidal or suicidal ideation.    Review of Systems  Psychiatric/Behavioral:  Negative for depression, hallucinations and suicidal ideas. The patient has insomnia.   All other systems reviewed and are negative.  Blood pressure (!) 135/111, pulse 74, temperature 97.8 F (36.6 C), resp. rate 17, height 5\' 9"  (1.753 m), weight 83.9 kg, SpO2 100 %. Body mass index is 27.32 kg/m.  Medical Decision Making:  Diagnostically, the patient presents with symptomology that is most indicative at this time of a very likely psychoactive substance induced psychosis, though unclear exactly what substance the patient has been taking that is causing the current presentation, as he has not given a UDS sample thus far.  Likely substance contributing to patient's presentation is amphetamines, given the collateral information obtained from family and IVC reports. The patient presents with atypical and irritable interpersonal style, short, vague, and evasive engagement with this  Clinical research associate, so it is difficult to discern if patient is experiencing psychosis still at this time or has capacity, thus for safety will continue to recommend involuntary commitment and inpatient stabilization.  Psychiatry is working on disposition at this time, will continue to follow the patient until disposition is  obtained.  Recommendations  -Continue IVC  -Agitation protocols  Haldol 5 mg p.o. or IM every 6 hours as needed Ativan 2 mg p.o. or IM every 6 hours as needed Benadryl 50 mg p.o. or IM every 6 hours as needed  -Recommend inpatient for further diagnostic clarity, safety, and stabilization of the patient  -Consider repeat CK to confirm downtrend  Disposition: Recommend psychiatric Inpatient admission when medically cleared.  Lenox Ponds, NP 08/28/2022 2:11 PM

## 2022-08-28 NOTE — ED Notes (Signed)
Pt's valuables to security, belongings envelope slip # [id number].

## 2022-08-28 NOTE — ED Triage Notes (Signed)
Pt arrives via [employer], under IVC.  Per IVC, pt has been acting strangely, talking to objects and things not visible to him, been aggressive and hostile. Walking in his bare feet long distances all around town.    Pt ambulatory, denies SI or HI.

## 2022-08-28 NOTE — Progress Notes (Signed)
Pt was accepted to [facility] Tuesday 08/30/2022; Bed Assignment [facility] Fax Number: [phone] (Adult) PENDING Negative COVID faxed.   Pt meets inpatient criteria per Shearon Stalls     Attending Physician will be Dr. Loni Beckwith     Report can be called to:[phone]-Pager number, please leave a returned phone number to receive a phone call back.     Pt can arrive after 9:00am   Care Team notified: Shearon Stalls, Shawn Panhwar,RN  Maryjean Ka, MSW, [facility] 08/28/2022 5:15 PM

## 2022-08-28 NOTE — ED Notes (Signed)
Pt pacing hall and refusing to listen to nurse and sitter. Attempting to reach exit door. PRN medication administered.

## 2022-08-28 NOTE — ED Notes (Signed)
Pt up pacing the hallways with safety sitter and attempting to leave the ed at this time. Pt was asked to go back to his stretcher with security accompanying pt.

## 2022-08-28 NOTE — ED Notes (Signed)
Family updated as to patient's status.

## 2022-08-28 NOTE — ED Notes (Signed)
Sister Cashton Kustra [phone] would like an update asap

## 2022-08-28 NOTE — ED Notes (Addendum)
Family updated as to patient's status. Pt's sister Alfredo Diekmann [phone] called for an update since she was the one that took out the IVC.  States he must have taken some drugs but that he wasn't himself at all. She reports pt was walking in the middle of the street, going to homes of neighbors knocking on their doors for no reason.

## 2022-08-29 MED ORDER — NICOTINE 14 MG/24HR TD PT24
14.0000 mg | MEDICATED_PATCH | Freq: Every day | TRANSDERMAL | Status: DC
  Administered 2022-08-29: 14 mg via TRANSDERMAL
  Filled 2022-08-29: qty 1

## 2022-08-29 MED ORDER — OLANZAPINE 5 MG PO TBDP
5.0000 mg | ORAL_TABLET | Freq: Every day | ORAL | Status: DC
  Administered 2022-08-29: 5 mg via ORAL
  Filled 2022-08-29: qty 1

## 2022-08-29 NOTE — ED Notes (Addendum)
Pt's fiance called and expressed frustration w/ not receiving updates. Told pt's fiance that I would message the [facility] NP and leave her number for them to call her w/ an update. Also let fiance know about visitation times and that when pt wakes up, I can let him know that she would like for pt to call her. Notified Effie Shy NP

## 2022-08-29 NOTE — ED Notes (Signed)
Pt awake at this time. Lunch tray offered. Pt accepted. Food being reheated. Pt calm and cooperative at this time. Updated pt on placement.

## 2022-08-29 NOTE — ED Notes (Signed)
Spoke w/ fiance who got pt's mother on a three-way phone call from [place] and updated them both. Told them that I would have pt call fiance when he wakes up and his mother is going to call around 8pm for pt to speak w/ her. Pt still sleeping at this time.

## 2022-08-29 NOTE — ED Notes (Signed)
Pt ambulated to room 52 w/o incident. Pt laid down on the bed to go to sleep. Will continue to monitor behavior.

## 2022-08-29 NOTE — ED Notes (Signed)
Pt's fiance calling again saying she spoke to pt's mother in [place] and wants pt to go to [address]. Notified Effie Shy NP. Fiance verbally aggressive.

## 2022-08-29 NOTE — Consult Note (Signed)
  Patient accepted to [facility], tomorrow 08/30/22, for inpatient psychiatric treatment. ED team notified.

## 2022-08-29 NOTE — ED Notes (Signed)
Pt up attempting to leave ed at this time time with sitter, security notified and escorted pt back to his room at this time.

## 2022-08-29 NOTE — ED Provider Notes (Signed)
  Physical Exam  BP 121/81 (BP Location: Right Leg)   Pulse (!) 56   Temp 98.3 F (36.8 C) (Oral)   Resp 18   Ht 5\' 9"  (1.753 m)   Wt 83.9 kg   SpO2 100%   BMI 27.32 kg/m   Physical Exam  Procedures  Procedures  ED Course / MDM    Medical Decision Making Amount and/or Complexity of Data Reviewed Labs: ordered.  Risk Prescription drug management.   Accepted at [facility] tomorrow.  Received as needed medication last night.       Benjiman Core, MD 08/29/22 [phone]

## 2022-08-29 NOTE — ED Notes (Signed)
Kevin Plan NP that pt's mother from [place] is wanting an update and repeatedly calling. Pt is currently sleeping. Will let pt know to call fiance and mother when pt wakes up

## 2022-08-29 NOTE — ED Notes (Signed)
Pt fixated on needing to smoke a cigarette while on the phone w/ his fiance. Pt irritable and not happy about going to [facility]. Pt made a statement about we told his fiance that she couldn't visit and that he couldn't have phone calls. Told pt that that was not true and this RN never told her that.

## 2022-08-29 NOTE — ED Notes (Incomplete)
Patient was seen pacing the unit; [employer] asked patient to return to room; pt continues to pace and then he walks out the door towards [place]; [employer] and [employer] walked patient back to unit; Sitter also within site of patient;

## 2022-08-29 NOTE — ED Notes (Signed)
Patient started to pace unit; [employer] asked patient to return to his room; pt continued to pace the unit and then walked out the unit into [place]; [place] and [employer] behind patient and was able to escort patient back to room; Pt not aggressive and calm at this time; pt agreeable to take PO night med to help with agitation; Pt denies SI/HI; pt states he wants to smoke and the patch does not help him; pt understands he is under IVC at this time and agrees to stay; pt given acceptance information for [facility] in the AM; Patient provided a snack-Monique,RN

## 2022-08-29 NOTE — ED Notes (Signed)
Pt now awake and eating dinner. Gave pt Fiance's phone number and pt is calling her now. Also relayed the message from his mom.

## 2022-08-30 NOTE — ED Provider Notes (Signed)
Patient excepted to [facility] with Dr. Sofie Hartigan. Stable during my care.   Virgina Norfolk, DO 08/30/22 [phone]

## 2022-08-30 NOTE — ED Notes (Signed)
Patient fiance at bedside to visit patient.

## 2022-08-30 NOTE — ED Notes (Signed)
IVC'd 08/28/22, exp 09/03/22  Kevin Sims after 9a High Elopement Risk

## 2022-08-30 NOTE — ED Notes (Signed)
[facility] called for transport to [facility].

## 2022-08-30 NOTE — ED Notes (Signed)
Patient calling sister, Rodell Perna, at this time.
